# Patient Record
Sex: Female | Born: 1955 | Race: White | Hispanic: No | Marital: Married
Health system: Southern US, Community
[De-identification: ages and names within clinical notes are randomized; demographics above are authoritative.]

---

## 1997-09-07 ENCOUNTER — Other Ambulatory Visit: Admission: RE | Admit: 1997-09-07 | Discharge: 1997-09-07 | Payer: Self-pay | Admitting: Internal Medicine

## 1998-12-30 ENCOUNTER — Other Ambulatory Visit: Admission: RE | Admit: 1998-12-30 | Discharge: 1998-12-30 | Payer: Self-pay | Admitting: *Deleted

## 1999-07-10 ENCOUNTER — Inpatient Hospital Stay (HOSPITAL_COMMUNITY): Admission: EM | Admit: 1999-07-10 | Discharge: 1999-07-18 | Payer: Self-pay | Admitting: Psychiatry

## 1999-07-19 ENCOUNTER — Other Ambulatory Visit: Admission: RE | Admit: 1999-07-19 | Discharge: 1999-07-24 | Payer: Self-pay | Admitting: Psychiatry

## 2000-02-05 ENCOUNTER — Other Ambulatory Visit: Admission: RE | Admit: 2000-02-05 | Discharge: 2000-02-05 | Payer: Self-pay | Admitting: *Deleted

## 2001-02-12 ENCOUNTER — Other Ambulatory Visit: Admission: RE | Admit: 2001-02-12 | Discharge: 2001-02-12 | Payer: Self-pay | Admitting: *Deleted

## 2002-02-09 ENCOUNTER — Other Ambulatory Visit: Admission: RE | Admit: 2002-02-09 | Discharge: 2002-02-09 | Payer: Self-pay | Admitting: Obstetrics and Gynecology

## 2003-03-08 ENCOUNTER — Other Ambulatory Visit: Admission: RE | Admit: 2003-03-08 | Discharge: 2003-03-08 | Payer: Self-pay | Admitting: Obstetrics and Gynecology

## 2007-08-18 ENCOUNTER — Ambulatory Visit (HOSPITAL_COMMUNITY): Admission: RE | Admit: 2007-08-18 | Discharge: 2007-08-18 | Payer: Self-pay | Admitting: Obstetrics and Gynecology

## 2008-09-29 ENCOUNTER — Ambulatory Visit (HOSPITAL_COMMUNITY): Admission: RE | Admit: 2008-09-29 | Discharge: 2008-09-29 | Payer: Self-pay | Admitting: Obstetrics and Gynecology

## 2009-09-30 ENCOUNTER — Ambulatory Visit (HOSPITAL_COMMUNITY): Admission: RE | Admit: 2009-09-30 | Discharge: 2009-09-30 | Payer: Self-pay | Admitting: Obstetrics and Gynecology

## 2010-08-04 NOTE — Discharge Summary (Signed)
[facility]  Patient:    KAHLEN, BOYDE                     MRN: [id number] Adm. Date:  [id number] Disc. Date: [id number] Attending:  Geoffery Lyons A                           Discharge Summary  CHIEF COMPLAINT:  This was the first admission to [facility] unit for this 55 year old female who was admitted after she expressed ideas to hurt herself and could not contract for safety.  She has been experiencing signs and symptoms suggestive of post traumatic stress disorder, as well as depression.  More recently, there has been an increase in crisis, not clear why, but later on it was called to the patients attention that it was the anniversary of the death of a child in 07-13-22.  This happened in a very traumatic way.  She has experienced some disturbance of perception, hallucinatory in nature, as well as flashbacks.  She has been acutely suicidal for the last week prior to this admission.  Six days prior to the admission, she was carrying a gun with her and was threatening to kill herself at physicians office.  She contracted for safety.  She has been seen daily by her therapist.  She tried to normalize her life, but over the weekend before the admission she became more overwhelmed.  By Monday, she saw the therapist, could not contract for safety, so inpatient treatment was recommended.  PAST PSYCHIATRIC HISTORY:  She had been seen [facility] for individual psychotherapy.  In the last six months, she has been in [place] to a specialty inpatient rehabilitation program.  Six weeks prior to this admission, she became very suicidal and was carrying a knife with her.  She went to [facility].  She more recently has had exacerbation of her post traumatic stress disorder.  She has been tried on several medications, combinations of medications, SSRI.  She has been on Zyprexa, Risperdal, and she was started on Depakote in  combination with her other medications.  The Depakote seemed to have helped, but recently she has been having more symptomatology.  SUBSTANCE ABUSE:  She denies the use of any substances.  PAST MEDICAL HISTORY:  Noncontributory.  ADMISSION MEDICATIONS:  Upon admission, she was taking Effexor up to 375 mg per day, Seroquel up to 250-300 mg in day time, Topamax 100 mg twice a day, Klonopin as needed, Depakote ER 500 two at bedtime.  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  She has been very formational through the years.  FAMILY HISTORY:  She denies mental illness in the family.  MENTAL STATUS EXAMINATION:  Upon admission, she is a well-groomed, well-developed, alert, cooperative female.  As she could not contract for safety, she was placed on one-to-one observation.  Some psychomotor retardation, very slow production.  Mood depressed, affect blunted, constrictive, sometimes perplexed.  Her thought processes were deranged.  Did not volunteer much information.  Did admit to suicidal ideations, with plan. Cognition limited to person, place and family.  Recent and remote memory were present.  ADMISSION DIAGNOSES: Axis I:     1. Post traumatic stress disorder.             2. Depressive disorder not otherwise specified. Axis II:    Deferred. Axis III:   No diagnosis. Axis  IV:    Severe. Axis V:     GAF upon admission 20/25, highest GAF in last year 75/80.  PHYSICAL EXAMINATION:  Performed by Treasa School, PA, failed to find any particular findings.  Laboratory work-up was within normal limits.  HOSPITAL COURSE:  She was admitted and started intensive individual and group psychotherapy.  She was given one-to-one observation through most of her stay. Her medications were adjusted.  Her Depakote was increased to 1500 mg at bedtime.  Her Seroquel was kept at 50 mg three times a day and 100 at bedtime. Effexor was kept at 375 mg.  She was enabled or  allowed to express anything having to do with the trauma.  Her individual therapy was very active through the hospitalization.  She was able to get the patient to deal with the trauma in a safe place where she was supported.  Her husband not honoring her request, told her brother about the traumatic experience that she had undergone.  Initially, she was very upset, very resistant because of this, but later on this turned to be a positive because the brother was able to meet with the therapist and they were able to basically discuss the trauma, and the brother became very supportive of her.  She was also able to talk to her sister and that also turned to be supportive.  Finally, there were some sessions held with the husband that helped the husband to understand more about the trauma she went through and the effect of trauma on her.  He had initially a difficult time understanding and accepting, but towards the end of the hospitalization it seemed the husband was starting to turn around and be more supportive towards her.  As the system became more supportive and the trauma she went through was out in the open, she started feeling some better. She was able to contract for safety, and the one-on-one observation was discontinued.  She had a few good days, seemed to be more motivated, more hopeful.  There were no active hallucinatory or flashback experiences.  She was sleeping better.  Her affect became brighter, and she could contract for safety, and she was discharged to outpatient treatment.  CONDITION UPON DISCHARGE:  In full contract with reality, mood improved, affect brighter.  To continue follow up with on an outpatient basis.  MEDICATIONS ON DISCHARGE: 1. Depakote ER 500 mg three at bedtime. 2. Effexor 375 mg every day. 3. Seroquel 50 mg twice a day and 100 mg at bedtime. 4. Klonopin 1 mg at bedtime.  DISCHARGE DIAGNOSES: Axis I:     1. Post traumatic stress disorder.              2. Depressive disorder not otherwise specified. Axis II:    No diagnosis. Axis III:   No diagnosis. Axis IV:    Severe. Axis V:     55/60.   FOLLOW-UP PLANS:  To provide some step-down treatment, considering how suicidal she was in the unit, we are going to go ahead and refer to the day program where she can continue to process some more of the trauma in a more controlled setting, and where she is going to be able to incorporate her functioning at home, while at the same time keeping the support of the hospital. DD:  08/17/99 TD:  08/19/99 Job: [id number]

## 2010-09-05 ENCOUNTER — Other Ambulatory Visit (HOSPITAL_COMMUNITY): Payer: Self-pay | Admitting: Obstetrics

## 2010-09-05 DIAGNOSIS — Z1231 Encounter for screening mammogram for malignant neoplasm of breast: Secondary | ICD-10-CM

## 2010-10-03 ENCOUNTER — Ambulatory Visit (HOSPITAL_COMMUNITY)
Admission: RE | Admit: 2010-10-03 | Discharge: 2010-10-03 | Disposition: A | Discharge status: Home / Self Care | Payer: BC Managed Care – PPO | Source: Ambulatory Visit | Attending: Obstetrics | Admitting: Obstetrics

## 2010-10-03 DIAGNOSIS — Z1231 Encounter for screening mammogram for malignant neoplasm of breast: Secondary | ICD-10-CM | POA: Insufficient documentation

## 2010-10-05 ENCOUNTER — Other Ambulatory Visit: Payer: Self-pay | Admitting: Obstetrics

## 2010-10-05 DIAGNOSIS — R928 Other abnormal and inconclusive findings on diagnostic imaging of breast: Secondary | ICD-10-CM

## 2010-10-17 ENCOUNTER — Ambulatory Visit
Admission: RE | Admit: 2010-10-17 | Discharge: 2010-10-17 | Disposition: A | Discharge status: Home / Self Care | Payer: BC Managed Care – PPO | Source: Ambulatory Visit | Attending: Obstetrics | Admitting: Obstetrics

## 2010-10-17 DIAGNOSIS — R928 Other abnormal and inconclusive findings on diagnostic imaging of breast: Secondary | ICD-10-CM

## 2011-10-04 ENCOUNTER — Other Ambulatory Visit (HOSPITAL_COMMUNITY): Payer: Self-pay | Admitting: Obstetrics and Gynecology

## 2011-10-04 DIAGNOSIS — Z1231 Encounter for screening mammogram for malignant neoplasm of breast: Secondary | ICD-10-CM

## 2011-10-23 ENCOUNTER — Ambulatory Visit (HOSPITAL_COMMUNITY)
Admission: RE | Admit: 2011-10-23 | Discharge: 2011-10-23 | Disposition: A | Discharge status: Home / Self Care | Payer: BC Managed Care – PPO | Source: Ambulatory Visit | Attending: Obstetrics and Gynecology | Admitting: Obstetrics and Gynecology

## 2011-10-23 DIAGNOSIS — Z1231 Encounter for screening mammogram for malignant neoplasm of breast: Secondary | ICD-10-CM

## 2012-05-28 ENCOUNTER — Encounter (INDEPENDENT_AMBULATORY_CARE_PROVIDER_SITE_OTHER): Payer: BC Managed Care – PPO | Admitting: Ophthalmology

## 2012-05-28 DIAGNOSIS — H2510 Age-related nuclear cataract, unspecified eye: Secondary | ICD-10-CM

## 2012-05-28 DIAGNOSIS — H43819 Vitreous degeneration, unspecified eye: Secondary | ICD-10-CM

## 2012-05-28 DIAGNOSIS — H3530 Unspecified macular degeneration: Secondary | ICD-10-CM

## 2012-10-21 ENCOUNTER — Other Ambulatory Visit (HOSPITAL_COMMUNITY): Payer: Self-pay | Admitting: Obstetrics

## 2012-10-21 DIAGNOSIS — Z1231 Encounter for screening mammogram for malignant neoplasm of breast: Secondary | ICD-10-CM

## 2012-11-19 ENCOUNTER — Ambulatory Visit (HOSPITAL_COMMUNITY)
Admission: RE | Admit: 2012-11-19 | Discharge: 2012-11-19 | Disposition: A | Discharge status: Home / Self Care | Payer: BC Managed Care – PPO | Source: Ambulatory Visit | Attending: Obstetrics | Admitting: Obstetrics

## 2012-11-19 DIAGNOSIS — Z1231 Encounter for screening mammogram for malignant neoplasm of breast: Secondary | ICD-10-CM

## 2013-04-06 ENCOUNTER — Other Ambulatory Visit: Payer: Self-pay | Admitting: Obstetrics

## 2013-04-06 DIAGNOSIS — N6320 Unspecified lump in the left breast, unspecified quadrant: Secondary | ICD-10-CM

## 2013-04-13 ENCOUNTER — Ambulatory Visit
Admission: RE | Admit: 2013-04-13 | Discharge: 2013-04-13 | Disposition: A | Discharge status: Home / Self Care | Payer: BC Managed Care – PPO | Source: Ambulatory Visit | Attending: Obstetrics | Admitting: Obstetrics

## 2013-04-13 DIAGNOSIS — N6320 Unspecified lump in the left breast, unspecified quadrant: Secondary | ICD-10-CM

## 2015-10-18 DIAGNOSIS — G4709 Other insomnia: Secondary | ICD-10-CM | POA: Diagnosis not present

## 2015-10-18 DIAGNOSIS — F411 Generalized anxiety disorder: Secondary | ICD-10-CM | POA: Diagnosis not present

## 2015-10-21 DIAGNOSIS — N390 Urinary tract infection, site not specified: Secondary | ICD-10-CM | POA: Diagnosis not present

## 2015-10-25 DIAGNOSIS — H524 Presbyopia: Secondary | ICD-10-CM | POA: Diagnosis not present

## 2015-10-26 DIAGNOSIS — N390 Urinary tract infection, site not specified: Secondary | ICD-10-CM | POA: Diagnosis not present

## 2015-12-20 DIAGNOSIS — Z23 Encounter for immunization: Secondary | ICD-10-CM | POA: Diagnosis not present

## 2016-04-17 DIAGNOSIS — G4709 Other insomnia: Secondary | ICD-10-CM | POA: Diagnosis not present

## 2016-04-17 DIAGNOSIS — F411 Generalized anxiety disorder: Secondary | ICD-10-CM | POA: Diagnosis not present

## 2016-08-20 DIAGNOSIS — N390 Urinary tract infection, site not specified: Secondary | ICD-10-CM | POA: Diagnosis not present

## 2016-10-10 DIAGNOSIS — G4709 Other insomnia: Secondary | ICD-10-CM | POA: Diagnosis not present

## 2016-10-10 DIAGNOSIS — F411 Generalized anxiety disorder: Secondary | ICD-10-CM | POA: Diagnosis not present

## 2016-10-18 ENCOUNTER — Other Ambulatory Visit: Payer: Self-pay | Admitting: Obstetrics

## 2016-10-18 DIAGNOSIS — Z1231 Encounter for screening mammogram for malignant neoplasm of breast: Secondary | ICD-10-CM

## 2016-10-25 ENCOUNTER — Ambulatory Visit
Admission: RE | Admit: 2016-10-25 | Discharge: 2016-10-25 | Disposition: A | Discharge status: Home / Self Care | Payer: BLUE CROSS/BLUE SHIELD | Source: Ambulatory Visit | Attending: Obstetrics | Admitting: Obstetrics

## 2016-10-25 DIAGNOSIS — Z1231 Encounter for screening mammogram for malignant neoplasm of breast: Secondary | ICD-10-CM | POA: Diagnosis not present

## 2016-12-18 DIAGNOSIS — Z23 Encounter for immunization: Secondary | ICD-10-CM | POA: Diagnosis not present

## 2016-12-22 DIAGNOSIS — N390 Urinary tract infection, site not specified: Secondary | ICD-10-CM | POA: Diagnosis not present

## 2016-12-22 DIAGNOSIS — R030 Elevated blood-pressure reading, without diagnosis of hypertension: Secondary | ICD-10-CM | POA: Diagnosis not present

## 2017-02-04 DIAGNOSIS — N390 Urinary tract infection, site not specified: Secondary | ICD-10-CM | POA: Diagnosis not present

## 2017-02-04 DIAGNOSIS — R030 Elevated blood-pressure reading, without diagnosis of hypertension: Secondary | ICD-10-CM | POA: Diagnosis not present

## 2017-04-10 DIAGNOSIS — F411 Generalized anxiety disorder: Secondary | ICD-10-CM | POA: Diagnosis not present

## 2017-06-08 DIAGNOSIS — N390 Urinary tract infection, site not specified: Secondary | ICD-10-CM | POA: Diagnosis not present

## 2017-09-04 DIAGNOSIS — Z23 Encounter for immunization: Secondary | ICD-10-CM | POA: Diagnosis not present

## 2017-10-02 DIAGNOSIS — F411 Generalized anxiety disorder: Secondary | ICD-10-CM | POA: Diagnosis not present

## 2017-11-02 DIAGNOSIS — R300 Dysuria: Secondary | ICD-10-CM | POA: Diagnosis not present

## 2017-11-27 DIAGNOSIS — Z23 Encounter for immunization: Secondary | ICD-10-CM | POA: Diagnosis not present

## 2017-11-28 ENCOUNTER — Other Ambulatory Visit: Payer: Self-pay | Admitting: Obstetrics

## 2017-11-28 DIAGNOSIS — Z1231 Encounter for screening mammogram for malignant neoplasm of breast: Secondary | ICD-10-CM

## 2017-12-18 DIAGNOSIS — Z23 Encounter for immunization: Secondary | ICD-10-CM | POA: Diagnosis not present

## 2017-12-24 ENCOUNTER — Ambulatory Visit
Admission: RE | Admit: 2017-12-24 | Discharge: 2017-12-24 | Disposition: A | Discharge status: Home / Self Care | Payer: BLUE CROSS/BLUE SHIELD | Source: Ambulatory Visit | Attending: Obstetrics | Admitting: Obstetrics

## 2017-12-24 DIAGNOSIS — Z1231 Encounter for screening mammogram for malignant neoplasm of breast: Secondary | ICD-10-CM | POA: Diagnosis not present

## 2018-02-21 DIAGNOSIS — N390 Urinary tract infection, site not specified: Secondary | ICD-10-CM | POA: Diagnosis not present

## 2018-02-21 DIAGNOSIS — R030 Elevated blood-pressure reading, without diagnosis of hypertension: Secondary | ICD-10-CM | POA: Diagnosis not present

## 2018-04-04 DIAGNOSIS — F4322 Adjustment disorder with anxiety: Secondary | ICD-10-CM | POA: Diagnosis not present

## 2018-04-04 DIAGNOSIS — F411 Generalized anxiety disorder: Secondary | ICD-10-CM | POA: Diagnosis not present

## 2018-04-22 DIAGNOSIS — Z13220 Encounter for screening for lipoid disorders: Secondary | ICD-10-CM | POA: Diagnosis not present

## 2018-04-22 DIAGNOSIS — Z0000 Encounter for general adult medical examination without abnormal findings: Secondary | ICD-10-CM | POA: Diagnosis not present

## 2018-04-22 DIAGNOSIS — Z23 Encounter for immunization: Secondary | ICD-10-CM | POA: Diagnosis not present

## 2018-09-26 DIAGNOSIS — H524 Presbyopia: Secondary | ICD-10-CM | POA: Diagnosis not present

## 2018-10-03 DIAGNOSIS — G4709 Other insomnia: Secondary | ICD-10-CM | POA: Diagnosis not present

## 2018-10-03 DIAGNOSIS — F411 Generalized anxiety disorder: Secondary | ICD-10-CM | POA: Diagnosis not present

## 2018-11-13 ENCOUNTER — Other Ambulatory Visit: Payer: Self-pay | Admitting: Obstetrics

## 2018-11-13 DIAGNOSIS — Z1231 Encounter for screening mammogram for malignant neoplasm of breast: Secondary | ICD-10-CM

## 2018-11-26 DIAGNOSIS — Z23 Encounter for immunization: Secondary | ICD-10-CM | POA: Diagnosis not present

## 2018-11-26 DIAGNOSIS — N390 Urinary tract infection, site not specified: Secondary | ICD-10-CM | POA: Diagnosis not present

## 2018-12-29 ENCOUNTER — Other Ambulatory Visit: Payer: Self-pay

## 2018-12-29 ENCOUNTER — Ambulatory Visit
Admission: RE | Admit: 2018-12-29 | Discharge: 2018-12-29 | Disposition: A | Discharge status: Home / Self Care | Payer: BLUE CROSS/BLUE SHIELD | Source: Ambulatory Visit | Attending: Obstetrics | Admitting: Obstetrics

## 2018-12-29 DIAGNOSIS — Z1231 Encounter for screening mammogram for malignant neoplasm of breast: Secondary | ICD-10-CM

## 2019-03-27 DIAGNOSIS — G4709 Other insomnia: Secondary | ICD-10-CM | POA: Diagnosis not present

## 2019-03-27 DIAGNOSIS — F411 Generalized anxiety disorder: Secondary | ICD-10-CM | POA: Diagnosis not present

## 2019-05-13 DIAGNOSIS — R82998 Other abnormal findings in urine: Secondary | ICD-10-CM | POA: Diagnosis not present

## 2019-05-13 DIAGNOSIS — K30 Functional dyspepsia: Secondary | ICD-10-CM | POA: Diagnosis not present

## 2019-05-13 DIAGNOSIS — R1032 Left lower quadrant pain: Secondary | ICD-10-CM | POA: Diagnosis not present

## 2019-07-24 DIAGNOSIS — Z13220 Encounter for screening for lipoid disorders: Secondary | ICD-10-CM | POA: Diagnosis not present

## 2019-07-24 DIAGNOSIS — Z0000 Encounter for general adult medical examination without abnormal findings: Secondary | ICD-10-CM | POA: Diagnosis not present

## 2019-07-24 DIAGNOSIS — Z1329 Encounter for screening for other suspected endocrine disorder: Secondary | ICD-10-CM | POA: Diagnosis not present

## 2019-09-05 DIAGNOSIS — Z20822 Contact with and (suspected) exposure to covid-19: Secondary | ICD-10-CM | POA: Diagnosis not present

## 2019-09-22 DIAGNOSIS — F411 Generalized anxiety disorder: Secondary | ICD-10-CM | POA: Diagnosis not present

## 2019-09-22 DIAGNOSIS — G4709 Other insomnia: Secondary | ICD-10-CM | POA: Diagnosis not present

## 2019-09-30 DIAGNOSIS — B078 Other viral warts: Secondary | ICD-10-CM | POA: Diagnosis not present

## 2019-11-24 ENCOUNTER — Other Ambulatory Visit: Payer: Self-pay | Admitting: Obstetrics

## 2019-11-24 DIAGNOSIS — Z0000 Encounter for general adult medical examination without abnormal findings: Secondary | ICD-10-CM

## 2019-12-30 ENCOUNTER — Ambulatory Visit
Admission: RE | Admit: 2019-12-30 | Discharge: 2019-12-30 | Disposition: A | Discharge status: Home / Self Care | Payer: BC Managed Care – PPO | Source: Ambulatory Visit | Attending: Obstetrics | Admitting: Obstetrics

## 2019-12-30 ENCOUNTER — Other Ambulatory Visit: Payer: Self-pay

## 2019-12-30 DIAGNOSIS — Z0000 Encounter for general adult medical examination without abnormal findings: Secondary | ICD-10-CM

## 2019-12-30 DIAGNOSIS — Z1231 Encounter for screening mammogram for malignant neoplasm of breast: Secondary | ICD-10-CM | POA: Diagnosis not present

## 2019-12-30 IMAGING — MG DIGITAL SCREENING BILAT W/ TOMO W/ CAD
8 series · 8 of 24 positions shown · non-contrast
Comparison: Previous exam(s).

CLINICAL DATA: Screening.

EXAM:
DIGITAL SCREENING BILATERAL MAMMOGRAM WITH TOMO AND CAD

[L CC synth-2D]
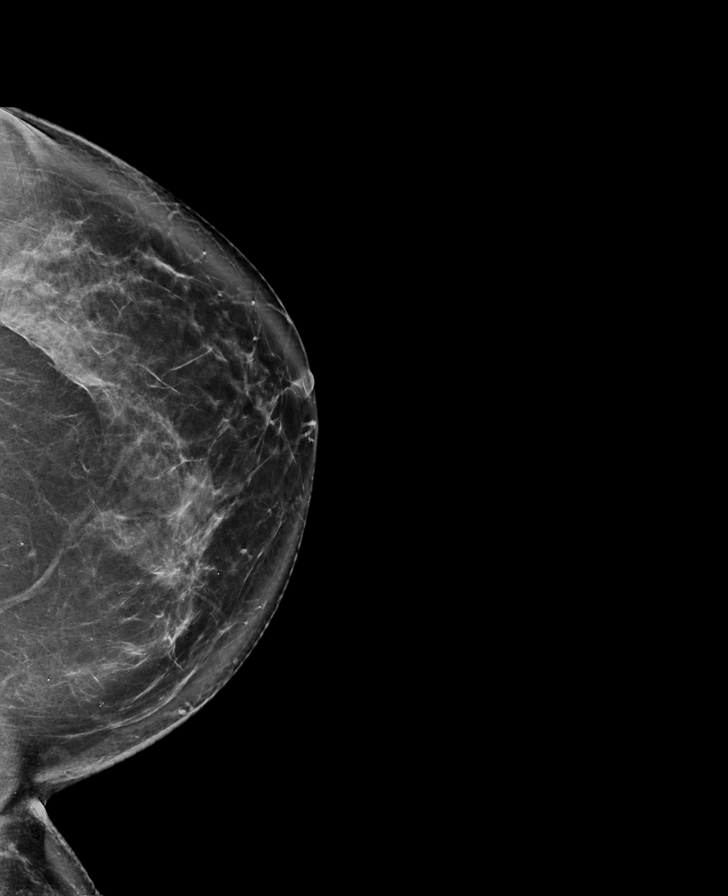

[R CC synth-2D]
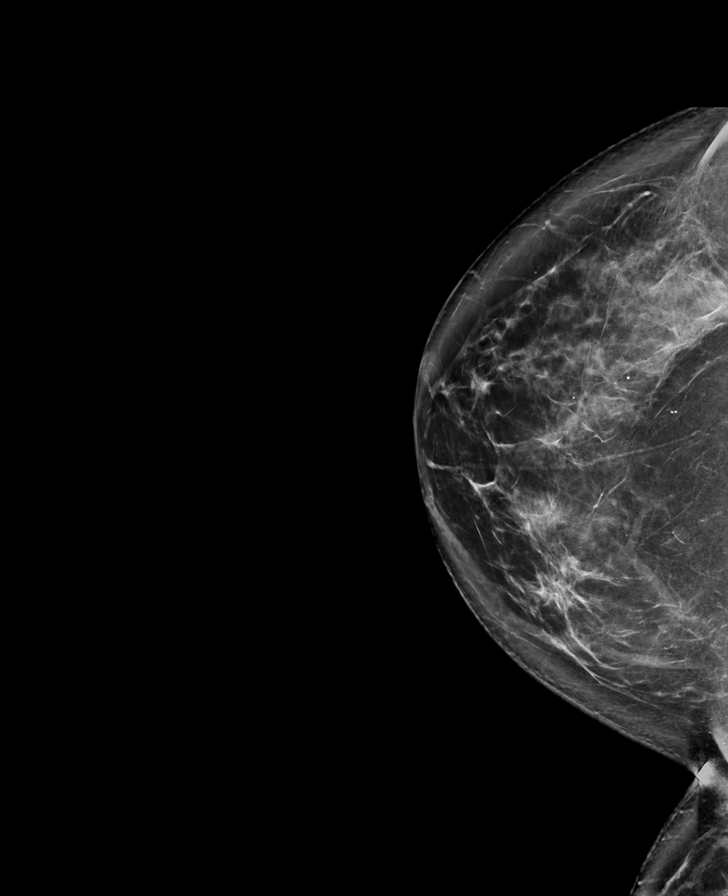

[R MLO synth-2D]
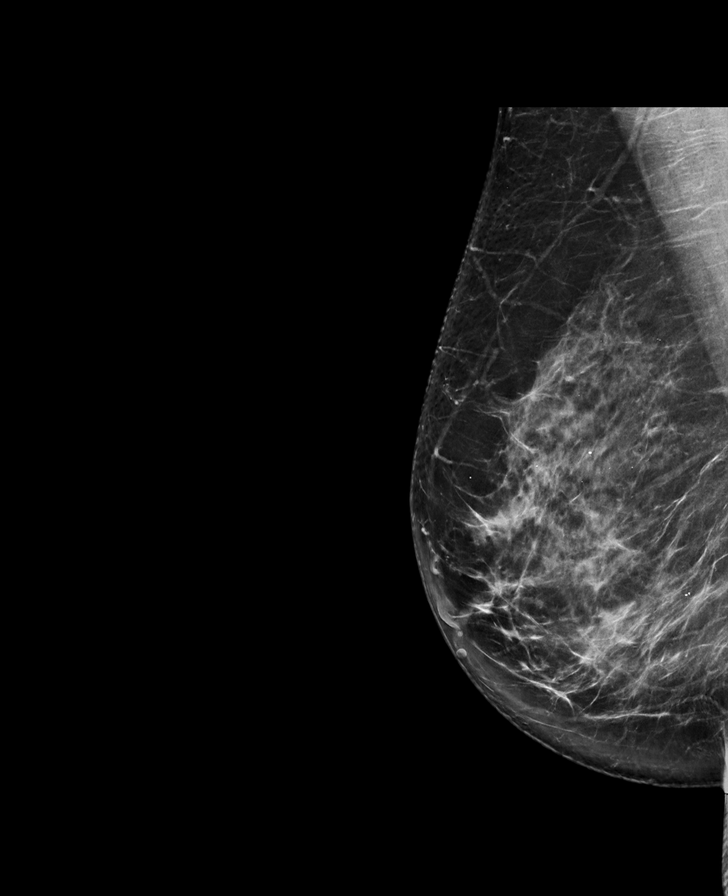

[L MLO synth-2D]
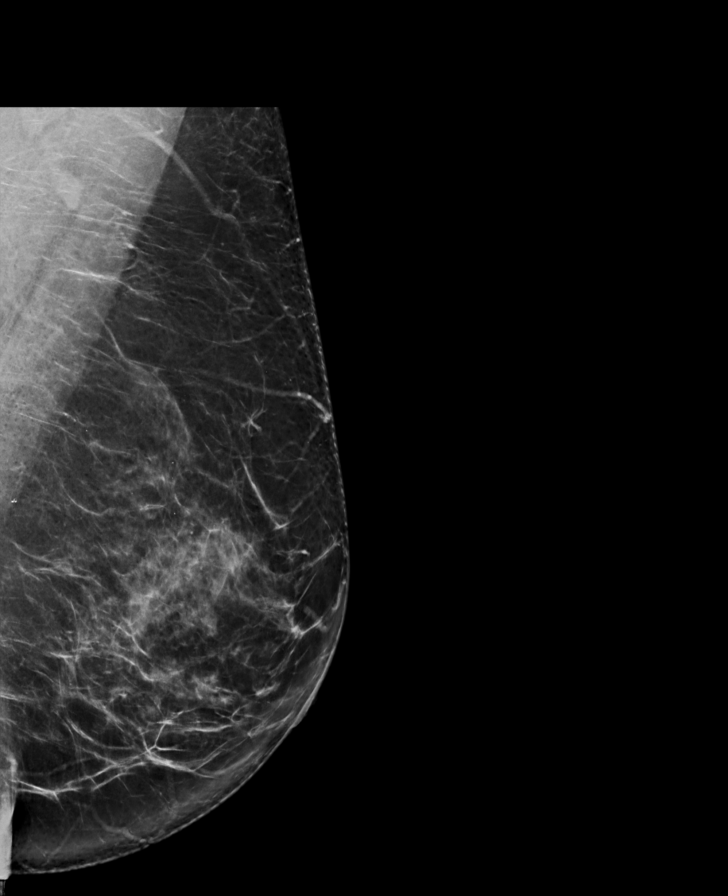

[L CC tomo · tomo slice 50/99.0]
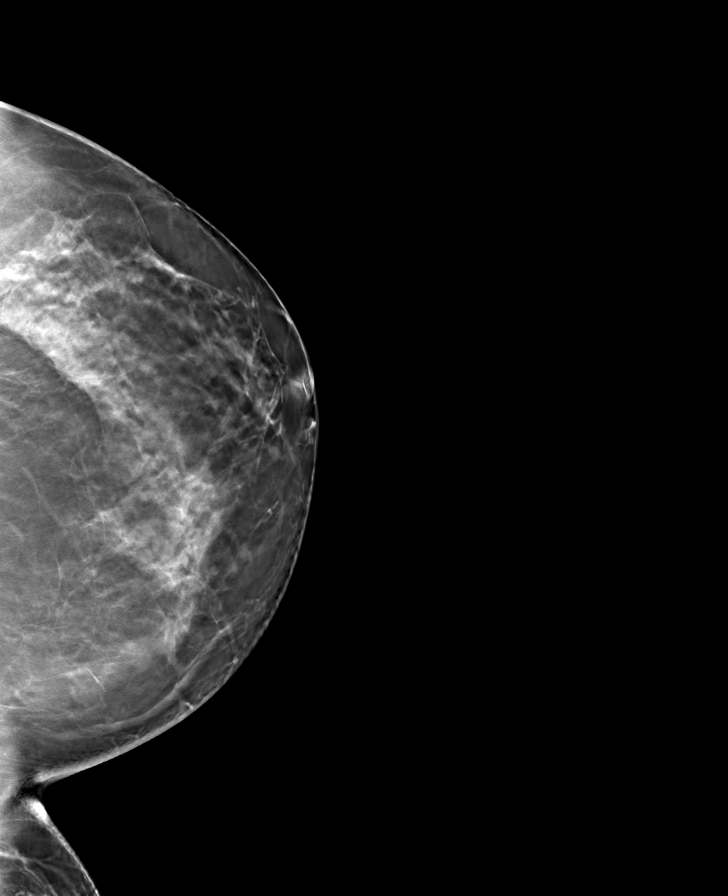

[R CC tomo · tomo slice 49/96.0]
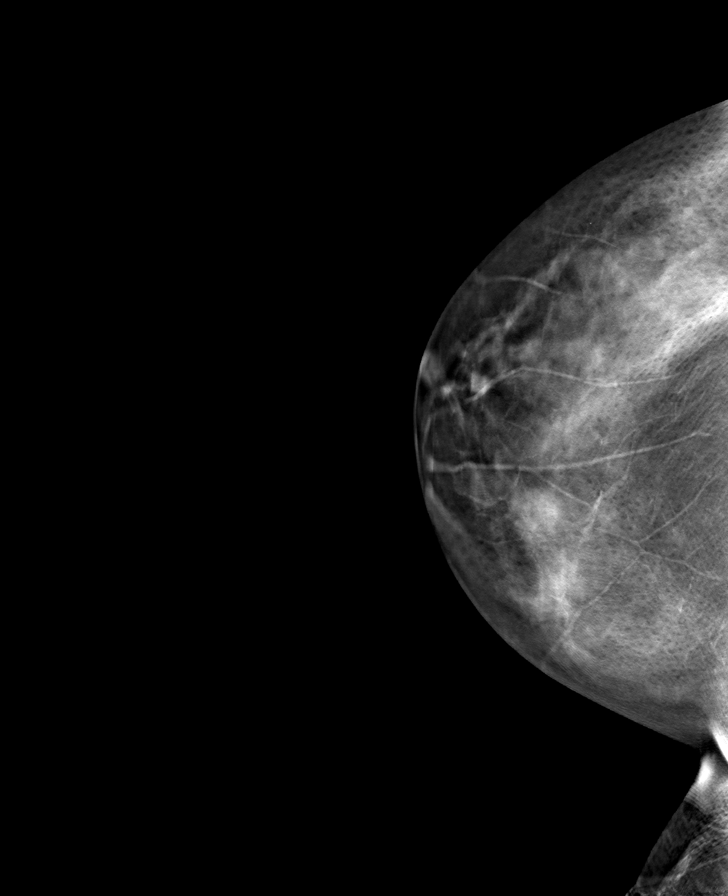

[R MLO tomo · tomo slice 47/93.0]
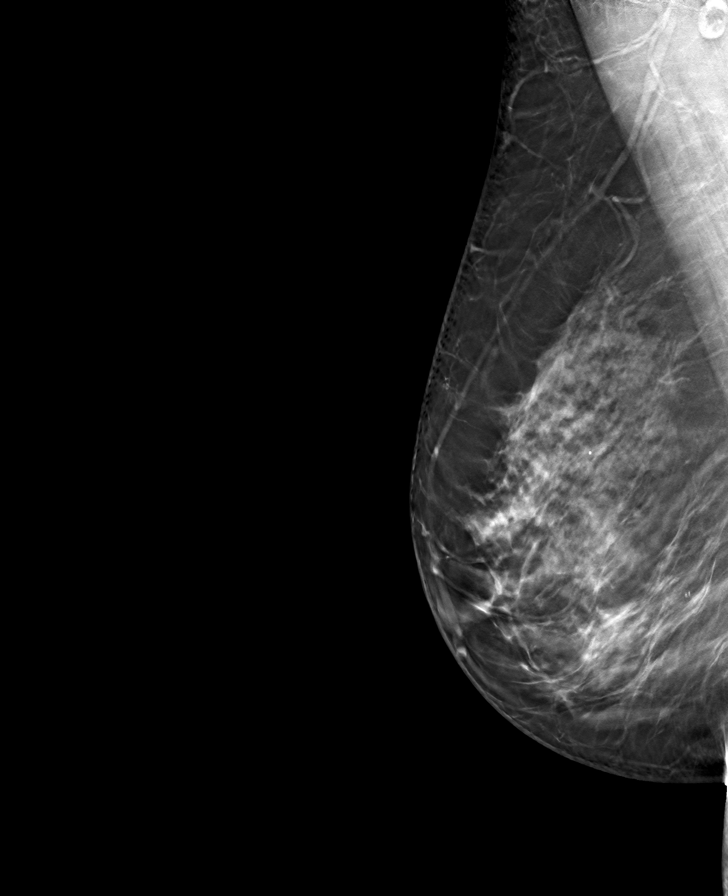

[L MLO tomo · tomo slice 47/94.0]
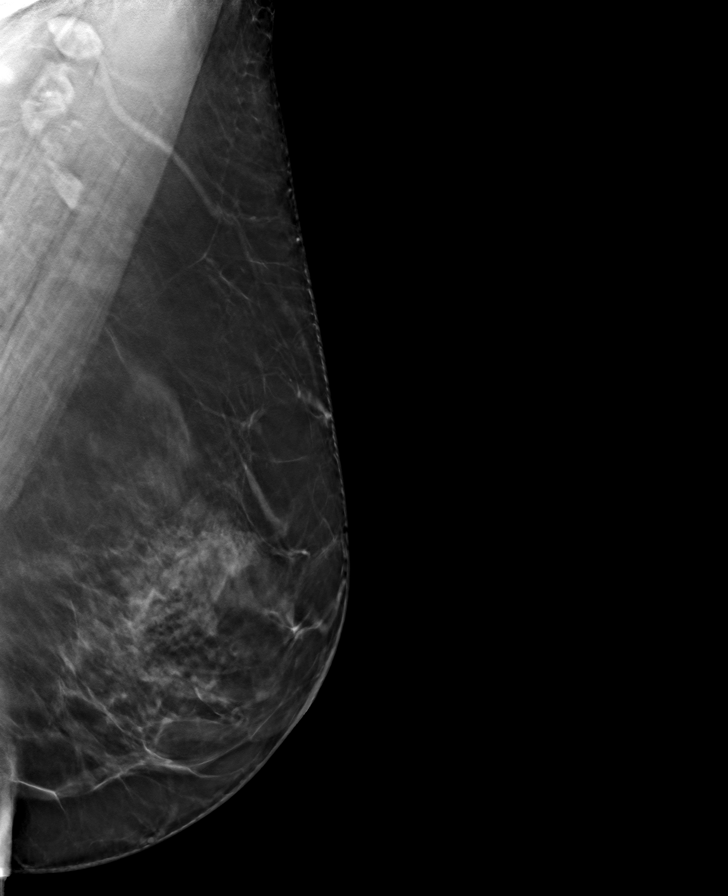

[8 of 24 positions shown; findings below may reference images not displayed]

ACR Breast Density Category b: There are scattered areas of
fibroglandular density.
FINDINGS: There are no findings suspicious for malignancy. Images were
processed with CAD.
IMPRESSION: No mammographic evidence of malignancy. A result letter of this
screening mammogram will be mailed directly to the patient.

RECOMMENDATION:
Screening mammogram in one year. (Code:[TQ])

BI-RADS CATEGORY  1: Negative.

## 2020-01-02 ENCOUNTER — Ambulatory Visit: Payer: Self-pay

## 2020-02-19 DIAGNOSIS — R1084 Generalized abdominal pain: Secondary | ICD-10-CM | POA: Diagnosis not present

## 2020-03-08 DIAGNOSIS — R7401 Elevation of levels of liver transaminase levels: Secondary | ICD-10-CM | POA: Diagnosis not present

## 2020-03-22 DIAGNOSIS — G4700 Insomnia, unspecified: Secondary | ICD-10-CM | POA: Diagnosis not present

## 2020-03-22 DIAGNOSIS — F411 Generalized anxiety disorder: Secondary | ICD-10-CM | POA: Diagnosis not present

## 2020-08-29 DIAGNOSIS — Z23 Encounter for immunization: Secondary | ICD-10-CM | POA: Diagnosis not present

## 2020-08-29 DIAGNOSIS — Z13220 Encounter for screening for lipoid disorders: Secondary | ICD-10-CM | POA: Diagnosis not present

## 2020-08-29 DIAGNOSIS — Z0000 Encounter for general adult medical examination without abnormal findings: Secondary | ICD-10-CM | POA: Diagnosis not present

## 2020-08-29 DIAGNOSIS — Z1159 Encounter for screening for other viral diseases: Secondary | ICD-10-CM | POA: Diagnosis not present

## 2020-08-31 ENCOUNTER — Other Ambulatory Visit: Payer: Self-pay | Admitting: Family Medicine

## 2020-08-31 DIAGNOSIS — E2839 Other primary ovarian failure: Secondary | ICD-10-CM

## 2020-08-31 DIAGNOSIS — Z1231 Encounter for screening mammogram for malignant neoplasm of breast: Secondary | ICD-10-CM

## 2020-09-14 DIAGNOSIS — F411 Generalized anxiety disorder: Secondary | ICD-10-CM | POA: Diagnosis not present

## 2021-01-03 ENCOUNTER — Ambulatory Visit
Admission: RE | Admit: 2021-01-03 | Discharge: 2021-01-03 | Disposition: A | Discharge status: Home / Self Care | Payer: BC Managed Care – PPO | Source: Ambulatory Visit | Attending: Family Medicine | Admitting: Family Medicine

## 2021-01-03 ENCOUNTER — Other Ambulatory Visit: Payer: Self-pay

## 2021-01-03 DIAGNOSIS — Z1231 Encounter for screening mammogram for malignant neoplasm of breast: Secondary | ICD-10-CM

## 2021-02-16 DIAGNOSIS — K5730 Diverticulosis of large intestine without perforation or abscess without bleeding: Secondary | ICD-10-CM | POA: Diagnosis not present

## 2021-02-16 DIAGNOSIS — D125 Benign neoplasm of sigmoid colon: Secondary | ICD-10-CM | POA: Diagnosis not present

## 2021-02-16 DIAGNOSIS — Z1211 Encounter for screening for malignant neoplasm of colon: Secondary | ICD-10-CM | POA: Diagnosis not present

## 2021-02-16 DIAGNOSIS — K648 Other hemorrhoids: Secondary | ICD-10-CM | POA: Diagnosis not present

## 2021-03-08 DIAGNOSIS — G4700 Insomnia, unspecified: Secondary | ICD-10-CM | POA: Diagnosis not present

## 2021-03-08 DIAGNOSIS — F411 Generalized anxiety disorder: Secondary | ICD-10-CM | POA: Diagnosis not present

## 2021-03-15 ENCOUNTER — Ambulatory Visit
Admission: RE | Admit: 2021-03-15 | Discharge: 2021-03-15 | Disposition: A | Discharge status: Home / Self Care | Payer: BC Managed Care – PPO | Source: Ambulatory Visit | Attending: Family Medicine | Admitting: Family Medicine

## 2021-03-15 DIAGNOSIS — E2839 Other primary ovarian failure: Secondary | ICD-10-CM

## 2021-03-15 DIAGNOSIS — M8589 Other specified disorders of bone density and structure, multiple sites: Secondary | ICD-10-CM | POA: Diagnosis not present

## 2021-03-15 DIAGNOSIS — Z780 Asymptomatic menopausal state: Secondary | ICD-10-CM | POA: Diagnosis not present

## 2021-06-01 DIAGNOSIS — H2513 Age-related nuclear cataract, bilateral: Secondary | ICD-10-CM | POA: Diagnosis not present

## 2021-06-01 DIAGNOSIS — H02831 Dermatochalasis of right upper eyelid: Secondary | ICD-10-CM | POA: Diagnosis not present

## 2021-06-01 DIAGNOSIS — H353111 Nonexudative age-related macular degeneration, right eye, early dry stage: Secondary | ICD-10-CM | POA: Diagnosis not present

## 2021-06-01 DIAGNOSIS — H5213 Myopia, bilateral: Secondary | ICD-10-CM | POA: Diagnosis not present

## 2021-06-14 DIAGNOSIS — Z01411 Encounter for gynecological examination (general) (routine) with abnormal findings: Secondary | ICD-10-CM | POA: Diagnosis not present

## 2021-06-14 DIAGNOSIS — Z0142 Encounter for cervical smear to confirm findings of recent normal smear following initial abnormal smear: Secondary | ICD-10-CM | POA: Diagnosis not present

## 2021-06-14 DIAGNOSIS — Z01419 Encounter for gynecological examination (general) (routine) without abnormal findings: Secondary | ICD-10-CM | POA: Diagnosis not present

## 2021-06-14 DIAGNOSIS — Z6829 Body mass index (BMI) 29.0-29.9, adult: Secondary | ICD-10-CM | POA: Diagnosis not present

## 2021-06-14 DIAGNOSIS — Z124 Encounter for screening for malignant neoplasm of cervix: Secondary | ICD-10-CM | POA: Diagnosis not present

## 2021-07-11 DIAGNOSIS — M8580 Other specified disorders of bone density and structure, unspecified site: Secondary | ICD-10-CM | POA: Diagnosis not present

## 2021-08-30 DIAGNOSIS — Z0000 Encounter for general adult medical examination without abnormal findings: Secondary | ICD-10-CM | POA: Diagnosis not present

## 2021-08-30 DIAGNOSIS — F5101 Primary insomnia: Secondary | ICD-10-CM | POA: Diagnosis not present

## 2021-08-30 DIAGNOSIS — Z13220 Encounter for screening for lipoid disorders: Secondary | ICD-10-CM | POA: Diagnosis not present

## 2021-08-30 DIAGNOSIS — Z23 Encounter for immunization: Secondary | ICD-10-CM | POA: Diagnosis not present

## 2021-08-30 DIAGNOSIS — F411 Generalized anxiety disorder: Secondary | ICD-10-CM | POA: Diagnosis not present

## 2021-09-06 DIAGNOSIS — I10 Essential (primary) hypertension: Secondary | ICD-10-CM | POA: Diagnosis not present

## 2021-09-21 DIAGNOSIS — L821 Other seborrheic keratosis: Secondary | ICD-10-CM | POA: Diagnosis not present

## 2021-09-21 DIAGNOSIS — L738 Other specified follicular disorders: Secondary | ICD-10-CM | POA: Diagnosis not present

## 2021-09-21 DIAGNOSIS — L658 Other specified nonscarring hair loss: Secondary | ICD-10-CM | POA: Diagnosis not present

## 2021-11-23 DIAGNOSIS — I10 Essential (primary) hypertension: Secondary | ICD-10-CM | POA: Diagnosis not present

## 2021-12-05 DIAGNOSIS — I10 Essential (primary) hypertension: Secondary | ICD-10-CM | POA: Diagnosis not present

## 2021-12-11 ENCOUNTER — Other Ambulatory Visit: Payer: Self-pay | Admitting: Obstetrics

## 2021-12-11 DIAGNOSIS — Z1231 Encounter for screening mammogram for malignant neoplasm of breast: Secondary | ICD-10-CM

## 2022-01-09 ENCOUNTER — Ambulatory Visit
Admission: RE | Admit: 2022-01-09 | Discharge: 2022-01-09 | Disposition: A | Discharge status: Home / Self Care | Payer: BC Managed Care – PPO | Source: Ambulatory Visit | Attending: Obstetrics | Admitting: Obstetrics

## 2022-01-09 DIAGNOSIS — Z1231 Encounter for screening mammogram for malignant neoplasm of breast: Secondary | ICD-10-CM | POA: Diagnosis not present

## 2022-02-02 DIAGNOSIS — L820 Inflamed seborrheic keratosis: Secondary | ICD-10-CM | POA: Diagnosis not present

## 2022-02-02 DIAGNOSIS — D2230 Melanocytic nevi of unspecified part of face: Secondary | ICD-10-CM | POA: Diagnosis not present

## 2022-02-02 DIAGNOSIS — L658 Other specified nonscarring hair loss: Secondary | ICD-10-CM | POA: Diagnosis not present

## 2022-02-15 DIAGNOSIS — F411 Generalized anxiety disorder: Secondary | ICD-10-CM | POA: Diagnosis not present

## 2022-02-15 DIAGNOSIS — G4700 Insomnia, unspecified: Secondary | ICD-10-CM | POA: Diagnosis not present

## 2022-03-26 DIAGNOSIS — I10 Essential (primary) hypertension: Secondary | ICD-10-CM | POA: Diagnosis not present

## 2022-03-26 DIAGNOSIS — Z1331 Encounter for screening for depression: Secondary | ICD-10-CM | POA: Diagnosis not present

## 2022-06-04 DIAGNOSIS — L658 Other specified nonscarring hair loss: Secondary | ICD-10-CM | POA: Diagnosis not present

## 2022-06-06 DIAGNOSIS — H31001 Unspecified chorioretinal scars, right eye: Secondary | ICD-10-CM | POA: Diagnosis not present

## 2022-06-06 DIAGNOSIS — H0100B Unspecified blepharitis left eye, upper and lower eyelids: Secondary | ICD-10-CM | POA: Diagnosis not present

## 2022-06-06 DIAGNOSIS — H0100A Unspecified blepharitis right eye, upper and lower eyelids: Secondary | ICD-10-CM | POA: Diagnosis not present

## 2022-06-06 DIAGNOSIS — H524 Presbyopia: Secondary | ICD-10-CM | POA: Diagnosis not present

## 2022-06-06 DIAGNOSIS — H5213 Myopia, bilateral: Secondary | ICD-10-CM | POA: Diagnosis not present

## 2022-06-25 DIAGNOSIS — Z01419 Encounter for gynecological examination (general) (routine) without abnormal findings: Secondary | ICD-10-CM | POA: Diagnosis not present

## 2022-08-24 DIAGNOSIS — F411 Generalized anxiety disorder: Secondary | ICD-10-CM | POA: Diagnosis not present

## 2022-08-24 DIAGNOSIS — F5101 Primary insomnia: Secondary | ICD-10-CM | POA: Diagnosis not present

## 2022-10-02 DIAGNOSIS — Z13220 Encounter for screening for lipoid disorders: Secondary | ICD-10-CM | POA: Diagnosis not present

## 2022-10-02 DIAGNOSIS — Z0000 Encounter for general adult medical examination without abnormal findings: Secondary | ICD-10-CM | POA: Diagnosis not present

## 2022-10-04 DIAGNOSIS — L658 Other specified nonscarring hair loss: Secondary | ICD-10-CM | POA: Diagnosis not present

## 2022-12-06 ENCOUNTER — Other Ambulatory Visit: Payer: Self-pay | Admitting: Obstetrics

## 2022-12-06 DIAGNOSIS — Z1231 Encounter for screening mammogram for malignant neoplasm of breast: Secondary | ICD-10-CM

## 2023-01-14 ENCOUNTER — Ambulatory Visit
Admission: RE | Admit: 2023-01-14 | Discharge: 2023-01-14 | Disposition: A | Discharge status: Home / Self Care | Payer: Medicare Other | Source: Ambulatory Visit | Attending: Obstetrics | Admitting: Obstetrics

## 2023-01-14 DIAGNOSIS — Z1231 Encounter for screening mammogram for malignant neoplasm of breast: Secondary | ICD-10-CM

## 2023-06-27 ENCOUNTER — Other Ambulatory Visit: Payer: Self-pay | Admitting: Obstetrics

## 2023-06-27 DIAGNOSIS — M8580 Other specified disorders of bone density and structure, unspecified site: Secondary | ICD-10-CM

## 2023-12-23 ENCOUNTER — Other Ambulatory Visit: Payer: Self-pay | Admitting: Obstetrics

## 2023-12-23 DIAGNOSIS — Z1231 Encounter for screening mammogram for malignant neoplasm of breast: Secondary | ICD-10-CM

## 2024-01-15 ENCOUNTER — Ambulatory Visit
Admission: RE | Admit: 2024-01-15 | Discharge: 2024-01-15 | Disposition: A | Discharge status: Home / Self Care | Source: Ambulatory Visit | Attending: Obstetrics | Admitting: Obstetrics

## 2024-01-15 DIAGNOSIS — Z1231 Encounter for screening mammogram for malignant neoplasm of breast: Secondary | ICD-10-CM

## 2024-03-05 ENCOUNTER — Other Ambulatory Visit

## 2024-03-31 ENCOUNTER — Ambulatory Visit (HOSPITAL_BASED_OUTPATIENT_CLINIC_OR_DEPARTMENT_OTHER)
Admission: RE | Admit: 2024-03-31 | Discharge: 2024-03-31 | Disposition: A | Discharge status: Home / Self Care | Source: Ambulatory Visit | Attending: Obstetrics | Admitting: Obstetrics

## 2024-03-31 DIAGNOSIS — M8580 Other specified disorders of bone density and structure, unspecified site: Secondary | ICD-10-CM | POA: Diagnosis present

## 2024-03-31 DIAGNOSIS — M8589 Other specified disorders of bone density and structure, multiple sites: Secondary | ICD-10-CM | POA: Diagnosis not present
